# Patient Record
Sex: Female | Born: 1990 | Race: Black or African American | Hispanic: No | Marital: Single | Smoking: Never smoker
Health system: Southern US, Community
[De-identification: ages and names within clinical notes are randomized; demographics above are authoritative.]

---
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
## 2009-07-10 ENCOUNTER — Emergency Department (HOSPITAL_COMMUNITY): Admission: EM | Admit: 2009-07-10 | Discharge: 2009-07-10 | Payer: Self-pay | Admitting: Emergency Medicine

## 2011-10-16 ENCOUNTER — Ambulatory Visit: Payer: Self-pay | Admitting: Family Medicine

## 2011-11-06 ENCOUNTER — Ambulatory Visit: Payer: Self-pay | Admitting: Family Medicine

## 2012-07-13 ENCOUNTER — Encounter (HOSPITAL_COMMUNITY): Payer: Self-pay | Admitting: *Deleted

## 2012-07-13 ENCOUNTER — Emergency Department (HOSPITAL_COMMUNITY)
Admission: EM | Admit: 2012-07-13 | Discharge: 2012-07-13 | Disposition: A | Discharge status: Home / Self Care | Payer: 59 | Attending: Emergency Medicine | Admitting: Emergency Medicine

## 2012-07-13 DIAGNOSIS — R51 Headache: Secondary | ICD-10-CM | POA: Insufficient documentation

## 2012-07-13 DIAGNOSIS — R61 Generalized hyperhidrosis: Secondary | ICD-10-CM | POA: Insufficient documentation

## 2012-07-13 DIAGNOSIS — R1084 Generalized abdominal pain: Secondary | ICD-10-CM | POA: Insufficient documentation

## 2012-07-13 DIAGNOSIS — M549 Dorsalgia, unspecified: Secondary | ICD-10-CM | POA: Insufficient documentation

## 2012-07-13 DIAGNOSIS — R52 Pain, unspecified: Secondary | ICD-10-CM | POA: Insufficient documentation

## 2012-07-13 DIAGNOSIS — R110 Nausea: Secondary | ICD-10-CM | POA: Insufficient documentation

## 2012-07-13 DIAGNOSIS — H538 Other visual disturbances: Secondary | ICD-10-CM | POA: Insufficient documentation

## 2012-07-13 NOTE — ED Notes (Signed)
EDPA at bedside for assessment 

## 2012-07-13 NOTE — ED Provider Notes (Signed)
Medical screening examination/treatment/procedure(s) were performed by non-physician practitioner and as supervising physician I was immediately available for consultation/collaboration.  Julie Manly, MD 07/13/12 2255 

## 2012-07-13 NOTE — ED Provider Notes (Signed)
History     CSN: [id number]  Arrival date & time 07/13/12  0316   None     Chief Complaint  Patient presents with  . Influenza    (Consider location/radiation/quality/duration/timing/severity/associated sxs/prior treatment) HPI Comments: Patient is a 22 year old female with no significant past medical history who presents with a variety of vague complaints. Patient states she has had a intermittent L temporal headache, nausea, blurry vision, difficulty concentrating, night sweats, and generalized abdominal discomfort. Patient states that all of these symptoms have been coming and going over the past 2 weeks with no worsening of symptoms. She denies aggravating and alleviating factors the symptoms. Patient also complains of a soreness inferior to her left shoulder blade, denying trauma to her back. Patient further denies fever, eye pain, ear pain or discharge, trouble swallowing, chest pain, shortness of breath or difficulty breathing, vomiting, diarrhea, hematuria, dysuria, inability to move her upper and lower extremities, as well as numbness and tingling.  Patient does not believe that her symptoms or flu related. She states she lives in a house with mold which has been a problem in the past; she admits to experiencing these symptoms a few years ago prior to the mold being cleaned out of her house. Patient has also been told by the clinic says she's been going to that she may have multiple sclerosis. Patient has a positive family history of MS in her father's brother, but denies any other family history. Patient has not had testing to confirm this diagnosis.  Patient is a 22 y.o. female presenting with flu symptoms. The history is provided by the patient. No language interpreter was used.  Influenza Presenting symptoms: headache and nausea   Presenting symptoms: no diarrhea, no fever, no rhinorrhea, no shortness of breath, no sore throat and no vomiting   Associated symptoms: no chills, no  ear pain and no congestion     History reviewed. No pertinent past medical history.  History reviewed. No pertinent past surgical history.  History reviewed. No pertinent family history.  History  Substance Use Topics  . Smoking status: Never Smoker   . Smokeless tobacco: Not on file  . Alcohol Use: No    OB History   Grav Para Term Preterm Abortions TAB SAB Ect Mult Living                  Review of Systems  Constitutional: Negative for fever and chills.  HENT: Negative for ear pain, congestion, sore throat, rhinorrhea, voice change and ear discharge.   Eyes: Positive for visual disturbance. Negative for pain.  Respiratory: Negative for chest tightness and shortness of breath.   Cardiovascular: Negative for chest pain.  Gastrointestinal: Positive for nausea. Negative for vomiting and diarrhea.  Genitourinary: Negative for dysuria and hematuria.  Musculoskeletal: Positive for back pain.  Skin: Negative for color change.  Neurological: Positive for headaches. Negative for syncope, weakness, light-headedness and numbness.  All other systems reviewed and are negative.    Allergies  Review of patient's allergies indicates no known allergies.  Home Medications  No current outpatient prescriptions on file.  BP 127/80  Pulse 74  Temp(Src) 98.2 F (36.8 C) (Oral)  Resp 15  SpO2 98%  Physical Exam  Nursing note and vitals reviewed. Constitutional: She is oriented to person, place, and time. She appears well-developed and well-nourished. No distress.  HENT:  Head: Normocephalic and atraumatic.  Right Ear: External ear normal.  Left Ear: External ear normal.  Mouth/Throat: Oropharynx is clear and  moist. No oropharyngeal exudate.  Eyes: Conjunctivae and EOM are normal. Pupils are equal, round, and reactive to light. Right eye exhibits no discharge. Left eye exhibits no discharge.  Neck: Normal range of motion. Neck supple.  Cardiovascular: Normal rate, regular rhythm,  normal heart sounds and intact distal pulses.   No murmur heard. Pulmonary/Chest: Effort normal and breath sounds normal. No respiratory distress. She has no wheezes. She has no rales.  Abdominal: Soft. Bowel sounds are normal. She exhibits no distension and no mass. There is no tenderness. There is no rebound and no guarding.  Musculoskeletal: Normal range of motion. She exhibits no edema.  Lymphadenopathy:    She has no cervical adenopathy.  Neurological: She is alert and oriented to person, place, and time. She has normal strength and normal reflexes. No cranial nerve deficit or sensory deficit. Coordination normal. GCS eye subscore is 4. GCS verbal subscore is 5. GCS motor subscore is 6.  Skin: Skin is warm and dry. No rash noted. She is not diaphoretic. No erythema.  Psychiatric: She has a normal mood and affect. Her behavior is normal.    ED Course  Procedures (including critical care time)  Labs Reviewed - No data to display No results found.   1. Body aches   2. Generalized headaches     MDM  Patient presents with a plethora of vague symptoms x 2 weeks that are intermittent in nature and not worsening in onset. Patient also states ? Hx of MS. Physical exam findings benign. Patient is neurovascularly intact, speaks in paragraphs and follows simple commands. She has f/u at the [facility] on Tuesday. Patient will be advised to f/u at this appt regarding symptoms; no further w/u in ED warranted at this time. Patient discussed with Dr. Dayna Barker prior to ED d/c.  Filed Vitals:   07/13/12 0321 07/13/12 0704  BP: 138/89 127/80  Pulse: 83 74  Temp: 98.2 F (36.8 C)   TempSrc: Oral   Resp: 18 15  SpO2: 97% 98%           Antony Madura, PA-C 07/13/12 1532

## 2012-07-13 NOTE — ED Notes (Signed)
C/o flu like sx. Reports HA, body aches, low back pain, night sweats, trouble concentrating. States, "do not think this is r/t the flu, I am sure it is r/t mold in my house", "I know I have mold and i have had these sx before and it was the mold". Also mentions: "i was told I might have MS at a clinic, but they need to test for it and I don't have insurance", has not set up another appt with clinic yet.  Pt alert, NAD, calm, interactive, MAEx4, skin W&D, resps e/u, speaking in clear complete sentences. Ambulatory with steady gait. Here with other.

## 2012-07-13 NOTE — ED Notes (Signed)
PT reports increased fatigue, lightheadedness/dizziness, difficulty concentrating, difficulty "focusing my eyesight", headache - pain that increases/decreases intermittently, nausea with loss of appetite (denies V/D), and body aches - especially in arms and legs x1wk.

## 2014-02-25 ENCOUNTER — Ambulatory Visit: Payer: 59

## 2014-04-01 ENCOUNTER — Ambulatory Visit: Payer: 59 | Admitting: Internal Medicine

## 2016-07-12 ENCOUNTER — Ambulatory Visit: Payer: Self-pay | Admitting: Family Medicine

## 2016-07-12 NOTE — Progress Notes (Deleted)
   Subjective:  Patient ID: Shari Hampton, female    DOB: Aug 16, 1990  Shari SchoolingAge: 26 y.o. MRN: [id number]  CC: No chief complaint on file.   HPI Shari SchoolingShanikwa Hampton presents for   Headaches:   No outpatient prescriptions prior to visit.   No facility-administered medications prior to visit.     ROS Review of Systems      Objective:  There were no vitals taken for this visit.  BP/Weight 07/13/2012  Systolic BP 127  Diastolic BP 80     Physical Exam   Assessment & Plan:   Problem List Items Addressed This Visit    None      No orders of the defined types were placed in this encounter.   Follow-up: No Follow-up on file.   Shari BarkMandesia R Hairston FNP

## 2016-07-19 ENCOUNTER — Ambulatory Visit: Payer: Self-pay | Admitting: Family Medicine

## 2016-09-27 ENCOUNTER — Ambulatory Visit (INDEPENDENT_AMBULATORY_CARE_PROVIDER_SITE_OTHER): Payer: Self-pay | Admitting: Physician Assistant

## 2017-03-16 ENCOUNTER — Ambulatory Visit: Payer: Self-pay | Attending: Nurse Practitioner | Admitting: Nurse Practitioner

## 2017-03-16 ENCOUNTER — Encounter: Payer: Self-pay | Admitting: Nurse Practitioner

## 2017-03-16 VITALS — BP 113/82 | HR 92 | Temp 98.8°F | Resp 18 | Ht 70.0 in | Wt 189.6 lb

## 2017-03-16 DIAGNOSIS — Z809 Family history of malignant neoplasm, unspecified: Secondary | ICD-10-CM | POA: Insufficient documentation

## 2017-03-16 DIAGNOSIS — G629 Polyneuropathy, unspecified: Secondary | ICD-10-CM | POA: Insufficient documentation

## 2017-03-16 DIAGNOSIS — M6281 Muscle weakness (generalized): Secondary | ICD-10-CM | POA: Insufficient documentation

## 2017-03-16 DIAGNOSIS — Z833 Family history of diabetes mellitus: Secondary | ICD-10-CM | POA: Insufficient documentation

## 2017-03-16 DIAGNOSIS — Z8249 Family history of ischemic heart disease and other diseases of the circulatory system: Secondary | ICD-10-CM | POA: Insufficient documentation

## 2017-03-16 DIAGNOSIS — H539 Unspecified visual disturbance: Secondary | ICD-10-CM

## 2017-03-16 DIAGNOSIS — H532 Diplopia: Secondary | ICD-10-CM | POA: Insufficient documentation

## 2017-03-16 NOTE — Progress Notes (Signed)
Assessment & Plan:  Shari Hampton was seen today for new patient (initial visit).  Diagnoses and all orders for this visit:  Visual disturbances -     Ambulatory referral to Ophthalmology -     CBC -     CMP14+EGFR -     Lipid panel  Muscle weakness (generalized)/Neuropathy -     Vitamin B12   Subjective:   Chief Complaint  Patient presents with  . New Patient (Initial Visit)    Patient stated that she is having trouble balancing her walk. Patient stated that she is having double vision.    HPI Shari Hampton 26 y.o. female presents to office today with intermittent symptoms of loss of balance and ataxia.  She also endorses visual disturbances of seeing of spots or "it's like when the TV has static on the screen. That's what I see sometimes". She also endorses numbness, burning and tingling in her feet as well as heaviness in her legs along with muscle weakness. Symptoms are occurring sporadically. Onset of symptoms was 8 years ago. She has not seen a PCP since she was a young child. She has had a few ED visits in the past. One specifically was for "body aches" in 2014. At that time she was endorsing a past history of ?MS.  Today she reports being concerned she may have MS or some other neurological disorder. She has not seen a neurologist for her symptoms.   History reviewed. No pertinent past medical history.  History reviewed. No pertinent surgical history.  Family History  Problem Relation Age of Onset  . Cancer Father   . Cancer Brother   . Diabetes Mother   . Hypertension Mother     Social History   Socioeconomic History  . Marital status: Single    Spouse name: Not on file  . Number of children: Not on file  . Years of education: Not on file  . Highest education level: Not on file  Social Needs  . Financial resource strain: Not on file  . Food insecurity - worry: Not on file  . Food insecurity - inability: Not on file  . Transportation needs - medical: Not on  file  . Transportation needs - non-medical: Not on file  Occupational History  . Not on file  Tobacco Use  . Smoking status: Never Smoker  . Smokeless tobacco: Never Used  Substance and Sexual Activity  . Alcohol use: No  . Drug use: No  . Sexual activity: No  Other Topics Concern  . Not on file  Social History Narrative  . Not on file    No outpatient medications prior to visit.   No facility-administered medications prior to visit.     No Known Allergies  Review of Systems  Constitutional: Negative for fever, malaise/fatigue and weight loss.  HENT: Negative.  Negative for nosebleeds.   Eyes: Positive for blurred vision and double vision. Negative for photophobia.  Respiratory: Negative.  Negative for cough and shortness of breath.   Cardiovascular: Negative.  Negative for chest pain, palpitations and leg swelling.  Gastrointestinal: Negative.  Negative for abdominal pain, constipation, diarrhea, heartburn, nausea and vomiting.  Musculoskeletal: Negative.  Negative for myalgias.  Neurological: Positive for dizziness, tingling, speech change and focal weakness. Negative for tremors, sensory change, seizures, loss of consciousness and headaches.  Endo/Heme/Allergies: Negative for environmental allergies.       Heat sensitivity   Psychiatric/Behavioral: Negative.  Negative for suicidal ideas.  Objective:    Physical Exam  Constitutional: She is oriented to person, place, and time. She appears well-developed and well-nourished. She is cooperative.  HENT:  Head: Normocephalic and atraumatic.  Eyes: Conjunctivae and lids are normal. Pupils are equal, round, and reactive to light. Right eye exhibits nystagmus (mild). Right eye exhibits normal extraocular motion. Left eye exhibits nystagmus (mild). Left eye exhibits normal extraocular motion.  Neck: Normal range of motion.  Cardiovascular: Normal rate, regular rhythm, normal heart sounds and intact distal pulses. Exam  reveals no gallop and no friction rub.  No murmur heard. Pulmonary/Chest: Effort normal and breath sounds normal. No tachypnea. No respiratory distress. She has no decreased breath sounds. She has no wheezes. She has no rhonchi. She has no rales. She exhibits no tenderness.  Abdominal: Soft. Bowel sounds are normal.  Musculoskeletal: Normal range of motion. She exhibits no edema.  Neurological: She is alert and oriented to person, place, and time. She has normal strength. She displays no atrophy and no tremor. No cranial nerve deficit or sensory deficit. She exhibits normal muscle tone. She displays no seizure activity. Coordination and gait normal.  Skin: Skin is warm and dry.  Psychiatric: She has a normal mood and affect. Her behavior is normal. Judgment and thought content normal.  Nursing note and vitals reviewed.   BP 113/82 (BP Location: Left Arm, Patient Position: Sitting, Cuff Size: Normal)   Pulse 92   Temp 98.8 F (37.1 C) (Oral)   Resp 18   Ht 5' 10"  (1.778 m)   Wt 189 lb 9.6 oz (86 kg)   BMI 27.20 kg/m  Wt Readings from Last 3 Encounters:  03/16/17 189 lb 9.6 oz (86 kg)    Patient has been counseled on age-appropriate routine health concerns for screening and prevention. These are reviewed and up-to-date. Referrals have been placed accordingly. Immunizations are up-to-date or declined.       Patient has been counseled extensively about nutrition and exercise as well as the importance of adherence with medications and regular follow-up. The patient was given clear instructions to go to ER or return to medical center if symptoms don't improve, worsen or new problems develop. The patient verbalized understanding.   Follow-up: Return in about 4 weeks (around 04/13/2017) for follow up; Needs appointment with financial representative.,.   Gildardo Pounds, FNP-BC [facility] and [facility] [place], [place]   03/16/2017, 2:34 PM

## 2017-03-16 NOTE — Patient Instructions (Addendum)
Visual Disturbances A visual disturbance is any problem that interferes with your normal vision. You can have a visual disturbance in one eye or both eyes. Some types of visual disturbances come and go without treatment and do not cause a permanent problem. Other visual disturbances may be a sign of a serious medical emergency. There are many signs and symptoms of a visual disturbance, including:  Blurred vision.  Inability to see certain colors.  Seeing floating spots (floaters).  Light sensitivity.  Flashing or shimmering lights.  Zigzagging lines or stars.  Seeing the floor as tilted (visual midline shift).  Being unaware of objects on one side of the body (visual spatial inattention).  Double vision.  Rhythmic, involuntary eye movements (nystagmus).  Hallucinations.  Temporary or permanent blindness.  Follow these instructions at home:  Take over-the-counter and prescription medicines only as told by your health care provider.  To lower your risk of the problems that can lead to visual disturbances: ? Eat a healthy diet. ? Maintain a healthy weight. Lose weight if you need to. ? Exercise regularly. Ask your health care provider what activities are safe for you.  Avoid migraine triggers when possible.  Keep all follow-up visits as told by your health care provider. This is important. Contact a health care provider if:  Your visual disturbance changes or becomes worse.  You notice any new visual disturbances. Get help right away if:  You lose most or all of your vision in one eye or both eyes.  You experience visual hallucinations.  You have chest pain, nausea, or vomiting along with visual disturbances. This information is not intended to replace advice given to you by your health care provider. Make sure you discuss any questions you have with your health care provider. Document Released: 05/25/2004 Document Revised: 09/29/2015 Document Reviewed:  09/24/2013 Elsevier Interactive Patient Education  2018 [employer]  Weakness Weakness is a lack of strength. You may feel weak all over your body (generalized), or you may feel weak in one specific part of your body (focal). There are many potential causes of weakness. Sometimes, the cause of your weakness may not be known. Some causes of weakness can be serious, so it is important to see your doctor. Follow these instructions at home:  Rest as needed.  Try to get enough sleep. Talk to your doctor about how much sleep you need each night.  Take over-the-counter and prescription medicines only as told by your doctor.  Eat a healthy, well-balanced diet. This includes: ? Proteins to build muscles, such as lean meats and fish. ? Fresh fruits and vegetables. ? Carbohydrates to boost energy, such as whole grains.  Drink enough fluid to keep your pee (urine) clear or pale yellow.  Do strength exercises, such as arm curls and leg raises, for 30 minutes at least 2 days a week or as told by your doctor.  Think about working with a physical therapist or trainer to help you get stronger.  Keep all follow-up visits as told by your doctor. This is important. Contact a doctor if:  Your weakness does not get better or it gets worse.  Your weakness affects your ability to: ? Think clearly. ? Do your normal daily activities. Get help right away if:  You have sudden weakness.  You have trouble breathing or shortness of breath.  You have problems with your vision.  You have trouble talking or swallowing.  You have trouble standing or walking.  You have chest pain.  You are light-headed.  You pass out (lose consciousness). This information is not intended to replace advice given to you by your health care provider. Make sure you discuss any questions you have with your health care provider. Document Released: 03/30/2008 Document Revised: 05/13/2015 Document Reviewed:  02/05/2015 Elsevier Interactive Patient Education  [employer]2018 Elsevier Inc.

## 2017-03-17 LAB — CMP14+EGFR
A/G RATIO: 1.4 (ref 1.2–2.2)
ALT: 12 IU/L (ref 0–32)
AST: 19 IU/L (ref 0–40)
Albumin: 4.4 g/dL (ref 3.5–5.5)
Alkaline Phosphatase: 56 IU/L (ref 39–117)
BUN/Creatinine Ratio: 15 (ref 9–23)
BUN: 13 mg/dL (ref 6–20)
CALCIUM: 9.5 mg/dL (ref 8.7–10.2)
CHLORIDE: 104 mmol/L (ref 96–106)
CO2: 17 mmol/L — ABNORMAL LOW (ref 20–29)
Creatinine, Ser: 0.84 mg/dL (ref 0.57–1.00)
GFR calc non Af Amer: 96 mL/min/{1.73_m2} (ref >59)
GFR, EST AFRICAN AMERICAN: 110 mL/min/{1.73_m2} (ref >59)
GLUCOSE: 73 mg/dL (ref 65–99)
Globulin, Total: 3.2 g/dL (ref 1.5–4.5)
POTASSIUM: 4.2 mmol/L (ref 3.5–5.2)
Sodium: 140 mmol/L (ref 134–144)
TOTAL PROTEIN: 7.6 g/dL (ref 6.0–8.5)

## 2017-03-17 LAB — LIPID PANEL
Chol/HDL Ratio: 3.8 ratio (ref 0.0–4.4)
Cholesterol, Total: 197 mg/dL (ref 100–199)
HDL: 52 mg/dL (ref >39)
LDL Calculated: 127 mg/dL — ABNORMAL HIGH (ref 0–99)
TRIGLYCERIDES: 92 mg/dL (ref 0–149)
VLDL CHOLESTEROL CAL: 18 mg/dL (ref 5–40)

## 2017-03-17 LAB — CBC
Hematocrit: 40.3 % (ref 34.0–46.6)
Hemoglobin: 13.3 g/dL (ref 11.1–15.9)
MCH: 28.9 pg (ref 26.6–33.0)
MCHC: 33 g/dL (ref 31.5–35.7)
MCV: 87 fL (ref 79–97)
PLATELETS: 297 10*3/uL (ref 150–379)
RBC: 4.61 x10E6/uL (ref 3.77–5.28)
RDW: 14.5 % (ref 12.3–15.4)
WBC: 5.5 10*3/uL (ref 3.4–10.8)

## 2017-03-17 LAB — VITAMIN B12: VITAMIN B 12: 1064 pg/mL (ref 232–1245)

## 2017-03-19 ENCOUNTER — Telehealth: Payer: Self-pay

## 2017-03-19 NOTE — Telephone Encounter (Signed)
-----   Message from Claiborne RiggZelda W Fleming, NP sent at 03/19/2017 10:53 AM EST ----- Labs are essentially normal. Will evaluate for further testing during your next office visit.

## 2017-03-20 NOTE — Telephone Encounter (Signed)
!!!  Labs are essentially normal. Will evaluate for further testing during your next office visit.!!!  CMA attempt to call patient regarding her lab result. If patient called, please notified her lab result.

## 2017-03-20 NOTE — Telephone Encounter (Signed)
-----   Message from Zelda W Fleming, NP sent at 03/19/2017 10:53 AM EST ----- Labs are essentially normal. Will evaluate for further testing during your next office visit. 

## 2017-04-13 ENCOUNTER — Ambulatory Visit: Payer: Medicaid Other | Admitting: Nurse Practitioner

## 2017-04-18 ENCOUNTER — Ambulatory Visit: Payer: Medicaid Other | Admitting: Nurse Practitioner

## 2017-05-02 ENCOUNTER — Ambulatory Visit: Payer: Medicaid Other | Admitting: Nurse Practitioner

## 2017-09-25 ENCOUNTER — Encounter (HOSPITAL_COMMUNITY): Payer: Self-pay | Admitting: Emergency Medicine

## 2017-09-25 ENCOUNTER — Ambulatory Visit (HOSPITAL_COMMUNITY)
Admission: EM | Admit: 2017-09-25 | Discharge: 2017-09-25 | Disposition: A | Discharge status: Home / Self Care | Payer: Medicaid Other | Attending: Internal Medicine | Admitting: Internal Medicine

## 2017-09-25 DIAGNOSIS — R490 Dysphonia: Secondary | ICD-10-CM | POA: Insufficient documentation

## 2017-09-25 DIAGNOSIS — Z79899 Other long term (current) drug therapy: Secondary | ICD-10-CM | POA: Insufficient documentation

## 2017-09-25 DIAGNOSIS — J029 Acute pharyngitis, unspecified: Secondary | ICD-10-CM

## 2017-09-25 LAB — POCT RAPID STREP A: Streptococcus, Group A Screen (Direct): NEGATIVE

## 2017-09-25 MED ORDER — IPRATROPIUM BROMIDE 0.06 % NA SOLN: 2.0000 | Freq: Four times a day (QID) | NASAL | 0 refills | Status: DC

## 2017-09-25 MED ORDER — OMEPRAZOLE 20 MG PO CPDR: 20.0000 mg | DELAYED_RELEASE_CAPSULE | Freq: Every day | ORAL | 0 refills | Status: DC

## 2017-09-25 MED ORDER — FLUTICASONE PROPIONATE 50 MCG/ACT NA SUSP: 2.0000 | Freq: Every day | NASAL | 0 refills | Status: DC

## 2017-09-25 NOTE — ED Triage Notes (Signed)
Pt sts sore throat and has "lost her voice"

## 2017-09-25 NOTE — ED Provider Notes (Signed)
[facility]    CSN: [id number] Arrival date & time: 09/25/17  1650     History   Chief Complaint Chief Complaint  Patient presents with  . Sore Throat    HPI Shari Hampton is a 27 y.o. female.   27 year old female comes in for chronic sore throat and loss of voice.  HPI obtained through patient by writing due to painful talking and hoarseness.  States symptoms first started 6 years ago around the throat after singing or talking too much.  States since then, she will have similar flareups when she talks  worsenings to much.  States she can talk every day, but if she talks more than a few hours, it will start hurting, with hoarseness.  States current episode started about 2 months ago after talking too much had a family gathering, states pain is constant, drinking hot tea with some improvement but does not resolve symptoms.  Talking or swallowing makes symptoms worse.  States current episode felt as if it traveled down to the chest, and got her worried.  She  has painful swallowing without feelings of stricture.  She does have burning sensation to the chest that is slightly worse with eating. During these episodes, she denies urinary symptoms such as cough, congestion, rhinorrhea.  Denies abdominal pain, nausea, vomiting.  Denies injury to the throat.  Never smoker.  Patient states that she also started having "bumps "on the throat when she talks too much.  States she does not think it is acne, as she is never had acne to the neck.  States these bumps only occurs when she talks too much.  The bumps then resolve after she rests her voice, though it leaves scars.     History reviewed. No pertinent past medical history.  There are no active problems to display for this patient.   History reviewed. No pertinent surgical history.  OB History   None      Home Medications    Prior to Admission medications   Medication Sig Start Date End Date Taking? Authorizing Provider    fluticasone (FLONASE) 50 MCG/ACT nasal spray Place 2 sprays into both nostrils daily. 09/25/17   Cathie Hoops, Monserath Neff V, PA-C  ipratropium (ATROVENT) 0.06 % nasal spray Place 2 sprays into both nostrils 4 (four) times daily. 09/25/17   Cathie Hoops, Arta Stump V, PA-C  omeprazole (PRILOSEC) 20 MG capsule Take 1 capsule (20 mg total) by mouth daily. 09/25/17   Belinda Fisher, PA-C    Family History Family History  Problem Relation Age of Onset  . Cancer Father   . Cancer Brother   . Diabetes Mother   . Hypertension Mother     Social History Social History   Tobacco Use  . Smoking status: Never Smoker  . Smokeless tobacco: Never Used  Substance Use Topics  . Alcohol use: No  . Drug use: No     Allergies   Patient has no known allergies.   Review of Systems Review of Systems  Reason unable to perform ROS: See HPI as above.     Physical Exam Triage Vital Signs ED Triage Vitals [09/25/17 1739]  Enc Vitals Group     BP 125/86     Pulse Rate 80     Resp 18     Temp 98.5 F (36.9 C)     Temp Source Oral     SpO2 100 %     Weight      Height  Head Circumference      Peak Flow      Pain Score      Pain Loc      Pain Edu?      Excl. in GC?    No data found.  Updated Vital Signs BP 125/86 (BP Location: Left Arm)   Pulse 80   Temp 98.5 F (36.9 C) (Oral)   Resp 18   SpO2 100%   Physical Exam  Constitutional: She is oriented to person, place, and time. She appears well-developed and well-nourished.  Non-toxic appearance. She does not appear ill. No distress.  HENT:  Head: Normocephalic and atraumatic.  Right Ear: Tympanic membrane, external ear and ear canal normal. Tympanic membrane is not erythematous and not bulging.  Left Ear: Tympanic membrane, external ear and ear canal normal. Tympanic membrane is not erythematous and not bulging.  Nose: Nose normal. Right sinus exhibits no maxillary sinus tenderness and no frontal sinus tenderness. Left sinus exhibits no maxillary sinus tenderness  and no frontal sinus tenderness.  Mouth/Throat: Uvula is midline, oropharynx is clear and moist and mucous membranes are normal. No uvula swelling. No oropharyngeal exudate, posterior oropharyngeal edema or posterior oropharyngeal erythema.  Eyes: Pupils are equal, round, and reactive to light. Conjunctivae are normal.  Neck: Normal range of motion. Neck supple.  Cardiovascular: Normal rate, regular rhythm and normal heart sounds. Exam reveals no gallop and no friction rub.  No murmur heard. Pulmonary/Chest: Effort normal and breath sounds normal. She has no decreased breath sounds. She has no wheezes. She has no rhonchi. She has no rales.  Abdominal: Soft. Bowel sounds are normal. She exhibits no distension. There is no tenderness. There is no rebound and no guarding.  Lymphadenopathy:    She has no cervical adenopathy.  Neurological: She is alert and oriented to person, place, and time.  Skin: Skin is warm and dry.  See picture below. No tenderness to palpation.  Psychiatric: She has a normal mood and affect. Her behavior is normal. Judgment normal.         UC Treatments / Results  Labs (all labs ordered are listed, but only abnormal results are displayed) Labs Reviewed  CULTURE, GROUP A STREP [facility])  POCT RAPID STREP A    EKG None  Radiology No results found.  Procedures Procedures (including critical care time)  Medications Ordered in UC Medications - No data to display  Initial Impression / Assessment and Plan / UC Course  I have reviewed the triage vital signs and the nursing notes.  Pertinent labs & imaging results that were available during my care of the patient were reviewed by me and considered in my medical decision making (see chart for details).    Discussed case with Dr Delton See. Will treat for PND and GERD to assess for possible contributing symptoms. Start flonase and atrovent for possible post nasal drip. Start omeprazole for GERD. Given chronic  symptoms, discussed with patient, will need further evaluation. Patient to follow up with ENT for further evaluation needed. Resources provided. Patient expresses understanding and agrees to plan.  Final Clinical Impressions(s) / UC Diagnoses   Final diagnoses:  Sore throat  Hoarseness of voice    ED Prescriptions    Medication Sig Dispense Auth. Provider   ipratropium (ATROVENT) 0.06 % nasal spray Place 2 sprays into both nostrils 4 (four) times daily. 15 mL Kaydenn Mclear V, PA-C   fluticasone (FLONASE) 50 MCG/ACT nasal spray Place 2 sprays into both nostrils daily. 1 g  Cathie Hoops, Eoghan Belcher V, PA-C   omeprazole (PRILOSEC) 20 MG capsule Take 1 capsule (20 mg total) by mouth daily. 15 capsule Threasa Alpha, [place] 09/25/17 1940

## 2017-09-25 NOTE — Discharge Instructions (Signed)
As discussed, you need further evaluation of causes of chronic hoarseness and sore throat.  Start Flonase and Atrovent for possible postnasal drip.  Start omeprazole for acid reflux.  Please follow-up with ENT for further evaluation and management needed.

## 2017-09-28 LAB — CULTURE, GROUP A STREP (THRC)

## 2017-10-24 ENCOUNTER — Ambulatory Visit: Payer: Medicaid Other | Admitting: Nurse Practitioner

## 2017-11-10 ENCOUNTER — Encounter (HOSPITAL_COMMUNITY): Payer: Self-pay

## 2017-11-10 ENCOUNTER — Other Ambulatory Visit: Payer: Self-pay

## 2017-11-10 ENCOUNTER — Emergency Department (HOSPITAL_COMMUNITY)
Admission: EM | Admit: 2017-11-10 | Discharge: 2017-11-10 | Disposition: A | Discharge status: Home / Self Care | Payer: Medicaid Other | Attending: Emergency Medicine | Admitting: Emergency Medicine

## 2017-11-10 ENCOUNTER — Emergency Department (HOSPITAL_COMMUNITY): Payer: Medicaid Other

## 2017-11-10 DIAGNOSIS — R0789 Other chest pain: Secondary | ICD-10-CM | POA: Insufficient documentation

## 2017-11-10 DIAGNOSIS — R0602 Shortness of breath: Secondary | ICD-10-CM | POA: Insufficient documentation

## 2017-11-10 LAB — BASIC METABOLIC PANEL
Anion gap: 12 (ref 5–15)
BUN: 8 mg/dL (ref 6–20)
CALCIUM: 9.7 mg/dL (ref 8.9–10.3)
CHLORIDE: 106 mmol/L (ref 98–111)
CO2: 21 mmol/L — ABNORMAL LOW (ref 22–32)
CREATININE: 0.88 mg/dL (ref 0.44–1.00)
Glucose, Bld: 92 mg/dL (ref 70–99)
Potassium: 3.3 mmol/L — ABNORMAL LOW (ref 3.5–5.1)
SODIUM: 139 mmol/L (ref 135–145)

## 2017-11-10 LAB — I-STAT TROPONIN, ED: TROPONIN I, POC: 0 ng/mL (ref 0.00–0.08)

## 2017-11-10 LAB — I-STAT BETA HCG BLOOD, ED (MC, WL, AP ONLY): I-stat hCG, quantitative: <5 m[IU]/mL (ref <5)

## 2017-11-10 LAB — CBC
HCT: 40.8 % (ref 36.0–46.0)
Hemoglobin: 13.5 g/dL (ref 12.0–15.0)
MCH: 28.5 pg (ref 26.0–34.0)
MCHC: 33.1 g/dL (ref 30.0–36.0)
MCV: 86.3 fL (ref 78.0–100.0)
PLATELETS: 289 10*3/uL (ref 150–400)
RBC: 4.73 MIL/uL (ref 3.87–5.11)
RDW: 13.3 % (ref 11.5–15.5)
WBC: 8.4 10*3/uL (ref 4.0–10.5)

## 2017-11-10 MED ORDER — PREDNISONE 20 MG PO TABS: 40.0000 mg | ORAL_TABLET | Freq: Every day | ORAL | 0 refills | Status: AC

## 2017-11-10 MED ORDER — ALBUTEROL SULFATE HFA 108 (90 BASE) MCG/ACT IN AERS: 2.0000 | INHALATION_SPRAY | Freq: Four times a day (QID) | RESPIRATORY_TRACT | 2 refills | Status: DC | PRN

## 2017-11-10 NOTE — ED Provider Notes (Signed)
[facility] EMERGENCY DEPARTMENT Provider Note   CSN: [id number] Arrival date & time: 11/10/17  1257   History   Chief Complaint Chief Complaint  Patient presents with  . Shortness of Breath  . Chest Pain    HPI Dmya Long is a 27 y.o. female with no significant past medical history who presents to the ED with chest tightness and dyspnea that began 2 days ago. She reports a feeling of non-radiating tightness in her upper chest/base of her neck that is associated with a feeling that she can't get enough air into her lungs. She reports associated nausea and tingling in her hands, but denies vomiting and diaphoresis. She states that she has gotten episodes like this for the past 3 years. They occur at random times and sometimes will wake her up from sleep. They are sometimes associated with exercise, but they also occur when she is sitting relaxing. The episodes used to be infrequent, but now have been occurring twice per week and lasting for 2-3 days. She takes an all-natural peppermint inhaler which used to help, but is no longer providing relief. She reports seasonal allergies, but denies a history of childhood asthma. She denies rhinorrhea, congestion, or sore throat. She denies exposure to animals, but reports that her house has mold. Ms. Hemberger says she does not have a history of panic attacks and describes herself as a very "laid back person." She denies tobacco use or illicit drug use, including cocaine.   History reviewed. No pertinent past medical history.  There are no active problems to display for this patient.   History reviewed. No pertinent surgical history.   OB History   None      Home Medications    Prior to Admission medications   Medication Sig Start Date End Date Taking? Authorizing Provider  albuterol (PROVENTIL HFA;VENTOLIN HFA) 108 (90 Base) MCG/ACT inhaler Inhale 2 puffs into the lungs every 6 (six) hours as needed for wheezing or shortness  of breath (chest tightness). 11/10/17   Tyese Finken, Cathleen Corti, MD  predniSONE (DELTASONE) 20 MG tablet Take 2 tablets (40 mg total) by mouth daily with breakfast for 4 days. 11/10/17 11/14/17  Tylar Merendino, Cathleen Corti, MD    Family History Family History  Problem Relation Age of Onset  . Cancer Father   . Cancer Brother   . Diabetes Mother   . Hypertension Mother   No family history of heart disease.  Social History Social History   Tobacco Use  . Smoking status: Never Smoker  . Smokeless tobacco: Never Used  Substance Use Topics  . Alcohol use: No  . Drug use: No     Allergies   Patient has no known allergies.   Review of Systems Review of Systems  Constitutional: Negative for chills, diaphoresis and fever.  HENT: Negative for congestion, rhinorrhea, sinus pressure, sore throat and trouble swallowing.   Eyes: Negative for visual disturbance.  Respiratory: Positive for chest tightness. Negative for cough, shortness of breath and wheezing.   Cardiovascular: Negative for chest pain and leg swelling.  Gastrointestinal: Positive for constipation and nausea. Negative for abdominal pain, diarrhea and vomiting.  Genitourinary: Negative for difficulty urinating and dysuria.  Musculoskeletal: Positive for back pain and neck pain.  Skin: Negative for rash and wound.  Neurological:       Weakness in left hand. Tingling in both hands.     Physical Exam Updated Vital Signs BP 112/85 (BP Location: Right Arm)   Pulse 86  Temp 98.6 F (37 C)   Resp 16   LMP 10/11/2017   SpO2 100%   Physical Exam  Constitutional: She is oriented to person, place, and time. She appears well-developed and well-nourished. No distress.  HENT:  Head: Normocephalic and atraumatic.  Mouth/Throat: Oropharynx is clear and moist. No oropharyngeal exudate or posterior oropharyngeal edema.  Eyes: Pupils are equal, round, and reactive to light. EOM are normal.  Neck: Normal range of motion. Neck supple.  No  supraclavicular adenopathy. No clavicular deformities.  Cardiovascular: Normal rate, regular rhythm and intact distal pulses. Exam reveals no gallop and no friction rub.  No murmur heard. Pulmonary/Chest: Effort normal and breath sounds normal. No accessory muscle usage. No tachypnea. No respiratory distress. She has no wheezes. She has no rhonchi. She has no rales. She exhibits tenderness. She exhibits no bony tenderness and no deformity.  Abdominal: Soft. Bowel sounds are normal. She exhibits no distension. There is no tenderness.  Musculoskeletal:       Right lower leg: She exhibits no edema.       Left lower leg: She exhibits no edema.  Lymphadenopathy:    She has no cervical adenopathy.  Neurological: She is alert and oriented to person, place, and time.  Skin: Skin is warm and dry. Capillary refill takes less than 2 seconds. No rash noted.  Psychiatric: She has a normal mood and affect. Her behavior is normal.     ED Treatments / Results  Labs (all labs ordered are listed, but only abnormal results are displayed) Labs Reviewed  BASIC METABOLIC PANEL - Abnormal; Notable for the following components:      Result Value   Potassium 3.3 (*)    CO2 21 (*)    All other components within normal limits  CBC  I-STAT TROPONIN, ED  I-STAT BETA HCG BLOOD, ED ([facility], [facility], [facility] ONLY)    EKG EKG Interpretation  Date/Time:  Saturday November 10 2017 13:03:54 EDT Ventricular Rate:  112 PR Interval:  136 QRS Duration: 74 QT Interval:  334 QTC Calculation: 455 R Axis:   85 Text Interpretation:  Sinus tachycardia Biatrial enlargement Artifact Abnormal ekg Confirmed by Gerhard Munch [phone]) on 11/10/2017 3:29:49 PM   Radiology Dg Chest 2 View  Result Date: 11/10/2017 CLINICAL DATA:  Shortness of breath and chest tightness EXAM: CHEST - 2 VIEW COMPARISON:  None FINDINGS: The heart size and mediastinal contours are within normal limits. Both lungs are clear. The visualized skeletal structures are  unremarkable. IMPRESSION: No active cardiopulmonary disease. Electronically Signed   By: Signa Kell M.D.   On: 11/10/2017 14:11    Procedures Procedures (including critical care time)  Medications Ordered in ED Medications - No data to display   Initial Impression / Assessment and Plan / ED Course  I have reviewed the triage vital signs and the nursing notes.  Pertinent labs & imaging results that were available during my care of the patient were reviewed by me and considered in my medical decision making (see chart for details).  Clinical Course as of Nov 10 1641  Sat Nov 10, 2017  1557 MCV: 86.3 [DD]    Clinical Course User Index [DD] Day Greb, Cathleen Corti, MD   Chrisanne Loose is a 27 y.o. female with no significant past medical history who presents to the ED with upper chest tightness and dyspnea that began 2 days ago. She has had multiple episodes of similar symptoms which have become more frequently recently. DDx includes ACS, pulmonary disease  including PNA and asthma, and panic disorder. Upon arrival to the ED, patient was afebrile, normotensive, tachypneic at 20, and tachycardic to 112. Labs were remarkable for negative troponin. EKG showed normal sinus tachycardia with biaatrial enlargment. CXR was normal. Upon assessment, patient reported feeling better and was no longer tachycardic or tachypneic. Lungs were clear on exam. Based on this negative workup, the patient may be experiencing reversible bronchospasm associated with asthma that has resolved by the time of her lung exam. Discussed this with the patient and recommended following up with a PCP for formal PFTs. The patient will be discharged home with an albuterol inhaler to use for future episodes and a 4 day course of 40mg  prednisone. Panic attacks have not been ruled out and may be reconsidered if PFTs are negative for asthma.   Final Clinical Impressions(s) / ED Diagnoses   Final diagnoses:  Shortness of breath  Chest  tightness    ED Discharge Orders        Ordered    albuterol (PROVENTIL HFA;VENTOLIN HFA) 108 (90 Base) MCG/ACT inhaler  Every 6 hours PRN     11/10/17 1617    predniSONE (DELTASONE) 20 MG tablet  Daily with breakfast    Note to Pharmacy:  IM program   11/10/17 1617       Novak Stgermaine, Cathleen Cortieborah N, MD 11/10/17 1643    Gerhard MunchLockwood, Robert, MD 11/10/17 2346

## 2017-11-10 NOTE — ED Triage Notes (Signed)
Onset 3 days shortness of breath and chest tightness.  Pt tearful and anxious at triage.

## 2017-11-10 NOTE — Discharge Instructions (Addendum)
You came to the emergency department with chest tightness and shortness of breath. A thorough evaluation ruled out heart disease and we were unable to appreciate any signs of lung disease. However, you may have asthma based on your symptoms. You are safe for discharge home with a prescription for an albuterol inhaler and a 4 day course of prednisone 40 mg. Please establish care with a PCP (number provided) and f/u with [facility] for consideration of formal pulmonary function testing.

## 2017-11-10 NOTE — ED Notes (Signed)
ED Provider at bedside. 

## 2017-11-23 ENCOUNTER — Encounter: Payer: Self-pay | Admitting: Nurse Practitioner

## 2017-11-23 ENCOUNTER — Ambulatory Visit: Payer: Self-pay | Attending: Nurse Practitioner | Admitting: Nurse Practitioner

## 2017-11-23 VITALS — BP 119/83 | HR 96 | Temp 98.5°F | Ht 70.0 in | Wt 177.8 lb

## 2017-11-23 DIAGNOSIS — R200 Anesthesia of skin: Secondary | ICD-10-CM | POA: Insufficient documentation

## 2017-11-23 DIAGNOSIS — R0602 Shortness of breath: Secondary | ICD-10-CM | POA: Insufficient documentation

## 2017-11-23 DIAGNOSIS — Z79899 Other long term (current) drug therapy: Secondary | ICD-10-CM | POA: Insufficient documentation

## 2017-11-23 DIAGNOSIS — Z8249 Family history of ischemic heart disease and other diseases of the circulatory system: Secondary | ICD-10-CM | POA: Insufficient documentation

## 2017-11-23 DIAGNOSIS — R202 Paresthesia of skin: Secondary | ICD-10-CM | POA: Insufficient documentation

## 2017-11-23 DIAGNOSIS — Z833 Family history of diabetes mellitus: Secondary | ICD-10-CM | POA: Insufficient documentation

## 2017-11-23 NOTE — Progress Notes (Signed)
Assessment & Plan:  Shari Hampton was seen today for hospitalization follow-up.  Diagnoses and all orders for this visit:  Numbness and tingling -     TSH -     VITAMIN D 25 Hydroxy (Vit-D Deficiency, Fractures)  Shortness of breath Resolving  Patient has been counseled on age-appropriate routine health concerns for screening and prevention. These are reviewed and up-to-date. Referrals have been placed accordingly. Immunizations are up-to-date or declined.    Subjective:   Chief Complaint  Patient presents with  . Hospitalization Follow-up    Pt. is here for HFU. Pt. stated she is having problem with balance and weakness on her right foot.    HPI Shari Hampton 27 y.o. female presents to office today for follow up to shortness of breath.   Shortness of Breath Improved. She was seen in the ED 11-10-2017 with complaints of SHOB and chest tightness. Work up was essentially negative. She was prescribed prednisone and an albuterol inhaler which she reports today she did not pick up.  Today she states the shortness of breath has improved and almost resolved.    Neurological Symptoms She endorses numbness and tingling in her right hand and right foot. She states this has been ongoing for "about 9 years" has worsened over the past few weeks. She denies any falls but endorses unsteadiness. She had addressed these symptoms with me in November however at that time I encouraged her to obtain financial assistance and to follow back up with me in 4 weeks. She did neither. She also endorses visual disturbances: Bilateral blurred vision, spots (she has not seen an eye specialist in the past).  She reports her paternal uncle has MS and she is concerned that she may have MS as well. Other symptoms include new onset urinary leakage occuring with exercising only.    Review of Systems  Constitutional: Negative for fever, malaise/fatigue and weight loss.  HENT: Negative.  Negative for nosebleeds.   Eyes:  Positive for blurred vision. Negative for double vision and photophobia.  Respiratory: Positive for shortness of breath (mild). Negative for cough.   Cardiovascular: Negative.  Negative for chest pain, palpitations and leg swelling.  Gastrointestinal: Negative.  Negative for heartburn, nausea and vomiting.  Musculoskeletal: Negative.  Negative for falls, joint pain and myalgias.  Neurological: Positive for tingling, sensory change and weakness. Negative for dizziness, speech change, focal weakness, seizures and headaches.  Psychiatric/Behavioral: Negative for suicidal ideas. The patient is nervous/anxious (history of panic attacks).     History reviewed. No pertinent past medical history.  History reviewed. No pertinent surgical history.  Family History  Problem Relation Age of Onset  . Cancer Father   . Cancer Brother   . Diabetes Mother   . Hypertension Mother     Social History Reviewed with no changes to be made today.   Outpatient Medications Prior to Visit  Medication Sig Dispense Refill  . albuterol (PROVENTIL HFA;VENTOLIN HFA) 108 (90 Base) MCG/ACT inhaler Inhale 2 puffs into the lungs every 6 (six) hours as needed for wheezing or shortness of breath (chest tightness). 1 Inhaler 2   No facility-administered medications prior to visit.     No Known Allergies     Objective:    BP 119/83 (BP Location: Right Arm, Patient Position: Sitting, Cuff Size: Normal)   Pulse 96   Temp 98.5 F (36.9 C) (Oral)   Ht 5\' 10"  (1.778 m)   Wt 177 lb 12.8 oz (80.6 kg)   BMI 25.51 kg/m  Wt Readings from Last 3 Encounters:  11/23/17 177 lb 12.8 oz (80.6 kg)  03/16/17 189 lb 9.6 oz (86 kg)    Physical Exam  Constitutional: She is oriented to person, place, and time. She appears well-developed and well-nourished. She is cooperative.  HENT:  Head: Normocephalic and atraumatic.  Eyes: EOM are normal.  Neck: Normal range of motion.  Cardiovascular: Normal rate, regular rhythm, normal  heart sounds and intact distal pulses. Exam reveals no gallop and no friction rub.  No murmur heard. Pulmonary/Chest: Effort normal and breath sounds normal. No tachypnea. No respiratory distress. She has no decreased breath sounds. She has no wheezes. She has no rhonchi. She has no rales. She exhibits no tenderness.  Abdominal: Soft. Bowel sounds are normal.  Musculoskeletal: Normal range of motion. She exhibits no edema.  Neurological: She is alert and oriented to person, place, and time. She has normal strength. She displays normal reflexes. No cranial nerve deficit or sensory deficit. She exhibits normal muscle tone. She displays a negative Romberg sign. Coordination and gait normal.  Skin: Skin is warm and dry.  Psychiatric: Her speech is normal. Judgment and thought content normal. She is slowed. Cognition and memory are normal.  Affect is flat  Nursing note and vitals reviewed.     Patient has been counseled extensively about nutrition and exercise as well as the importance of adherence with medications and regular follow-up. The patient was given clear instructions to go to ER or return to medical center if symptoms don't improve, worsen or new problems develop. The patient verbalized understanding.   Follow-up: Return for schedule PAP, Needs appointment with financial representative.Claiborne Rigg.   Djon Tith W Eathan Groman, FNP-BC [facility] [place], [place] [phone]   11/23/2017, 11:41 AM

## 2017-11-23 NOTE — Patient Instructions (Signed)
Neuropathic Pain Neuropathic pain is pain caused by damage to the nerves that are responsible for certain sensations in your body (sensory nerves). The pain can be caused by damage to:  The sensory nerves that send signals to your spinal cord and brain (peripheral nervous system).  The sensory nerves in your brain or spinal cord (central nervous system).  Neuropathic pain can make you more sensitive to pain. What would be a minor sensation for most people may feel very painful if you have neuropathic pain. This is usually a long-term condition that can be difficult to treat. The type of pain can differ from person to person. It may start suddenly (acute), or it may develop slowly and last for a long time (chronic). Neuropathic pain may come and go as damaged nerves heal or may stay at the same level for years. It often causes emotional distress, loss of sleep, and a lower quality of life. What are the causes? The most common cause of damage to a sensory nerve is diabetes. Many other diseases and conditions can also cause neuropathic pain. Causes of neuropathic pain can be classified as:  Toxic. Many drugs and chemicals can cause toxic damage. The most common cause of toxic neuropathic pain is damage from drug treatment for cancer (chemotherapy).  Metabolic. This type of pain can happen when a disease causes imbalances that damage nerves. Diabetes is the most common of these diseases. Vitamin B deficiency caused by long-term alcohol abuse is another common cause.  Traumatic. Any injury that cuts, crushes, or stretches a nerve can cause damage and pain. A common example is feeling pain after losing an arm or leg (phantom limb pain).  Compression-related. If a sensory nerve gets trapped or compressed for a long period of time, the blood supply to the nerve can be cut off.  Vascular. Many blood vessel diseases can cause neuropathic pain by decreasing blood supply and oxygen to nerves.  Autoimmune.  This type of pain results from diseases in which the body's defense system mistakenly attacks sensory nerves. Examples of autoimmune diseases that can cause neuropathic pain include lupus and multiple sclerosis.  Infectious. Many types of viral infections can damage sensory nerves and cause pain. Shingles infection is a common cause of this type of pain.  Inherited. Neuropathic pain can be a symptom of many diseases that are passed down through families (genetic).  What are the signs or symptoms? The main symptom is pain. Neuropathic pain is often described as:  Burning.  Shock-like.  Stinging.  Hot or cold.  Itching.  How is this diagnosed? No single test can diagnose neuropathic pain. Your health care provider will do a physical exam and ask you about your pain. You may use a pain scale to describe how bad your pain is. You may also have tests to see if you have a high sensitivity to pain and to help find the cause and location of any sensory nerve damage. These tests may include:  Imaging studies, such as: ? X-rays. ? CT scan. ? MRI.  Nerve conduction studies to test how well nerve signals travel through your sensory nerves (electrodiagnostic testing).  Stimulating your sensory nerves through electrodes on your skin and measuring the response in your spinal cord and brain (somatosensory evoked potentials).  How is this treated? Treatment for neuropathic pain may change over time. You may need to try different treatment options or a combination of treatments. Some options include:  Over-the-counter pain relievers.  Prescription medicines. Some medicines   used to treat other conditions may also help neuropathic pain. These include medicines to: ? Control seizures (anticonvulsants). ? Relieve depression (antidepressants).  Prescription-strength pain relievers (narcotics). These are usually used when other pain relievers do not help.  Transcutaneous nerve stimulation (TENS).  This uses electrical currents to block painful nerve signals. The treatment is painless.  Topical and local anesthetics. These are medicines that numb the nerves. They can be injected as a nerve block or applied to the skin.  Alternative treatments, such as: ? Acupuncture. ? Meditation. ? Massage. ? Physical therapy. ? Pain management programs. ? Counseling.  Follow these instructions at home:  Learn as much as you can about your condition.  Take medicines only as directed by your health care provider.  Work closely with all your health care providers to find what works best for you.  Have a good support system at home.  Consider joining a chronic pain support group. Contact a health care provider if:  Your pain treatments are not helping.  You are having side effects from your medicines.  You are struggling with fatigue, mood changes, depression, or anxiety. This information is not intended to replace advice given to you by your health care provider. Make sure you discuss any questions you have with your health care provider. Document Released: 01/13/2004 Document Revised: 11/05/2015 Document Reviewed: 09/25/2013 Elsevier Interactive Patient Education  2018 [employer]  Peripheral Neuropathy Peripheral neuropathy is a type of nerve damage. It affects nerves that carry signals between the spinal cord and other parts of the body. These are called peripheral nerves. With peripheral neuropathy, one nerve or a group of nerves may be damaged. What are the causes? Many things can damage peripheral nerves. For some people with peripheral neuropathy, the cause is unknown. Some causes include:  Diabetes. This is the most common cause of peripheral neuropathy.  Injury to a nerve.  Pressure or stress on a nerve that lasts a long time.  Too little vitamin B. Alcoholism can lead to this.  Infections.  Autoimmune diseases, such as multiple sclerosis and systemic lupus  erythematosus.  Inherited nerve diseases.  Some medicines, such as cancer drugs.  Toxic substances, such as lead and mercury.  Too little blood flowing to the legs.  Kidney disease.  Thyroid disease.  What are the signs or symptoms? Different people have different symptoms. The symptoms you have will depend on which of your nerves is damaged. Common symptoms include:  Loss of feeling (numbness) in the feet and hands.  Tingling in the feet and hands.  Pain that burns.  Very sensitive skin.  Weakness.  Not being able to move a part of the body (paralysis).  Muscle twitching.  Clumsiness or poor coordination.  Loss of balance.  Not being able to control your bladder.  Feeling dizzy.  Sexual problems.  How is this diagnosed? Peripheral neuropathy is a symptom, not a disease. Finding the cause of peripheral neuropathy can be hard. To figure that out, your health care provider will take a medical history and do a physical exam. A neurological exam will also be done. This involves checking things affected by your brain, spinal cord, and nerves (nervous system). For example, your health care provider will check your reflexes, how you move, and what you can feel. Other types of tests may also be ordered, such as:  Blood tests.  A test of the fluid in your spinal cord.  Imaging tests, such as CT scans or an MRI.  Electromyography (  EMG). This test checks the nerves that control muscles.  Nerve conduction velocity tests. These tests check how fast messages pass through your nerves.  Nerve biopsy. A small piece of nerve is removed. It is then checked under a microscope.  How is this treated?  Medicine is often used to treat peripheral neuropathy. Medicines may include: ? Pain-relieving medicines. Prescription or over-the-counter medicine may be suggested. ? Antiseizure medicine. This may be used for pain. ? Antidepressants. These also may help ease pain from  neuropathy. ? Lidocaine. This is a numbing medicine. You might wear a patch or be given a shot. ? Mexiletine. This medicine is typically used to help control irregular heart rhythms.  Surgery. Surgery may be needed to relieve pressure on a nerve or to destroy a nerve that is causing pain.  Physical therapy to help movement.  Assistive devices to help movement. Follow these instructions at home:  Only take over-the-counter or prescription medicines as directed by your health care provider. Follow the instructions carefully for any given medicines. Do not take any other medicines without first getting approval from your health care provider.  If you have diabetes, work closely with your health care provider to keep your blood sugar under control.  If you have numbness in your feet: ? Check every day for signs of injury or infection. Watch for redness, warmth, and swelling. ? Wear padded socks and comfortable shoes. These help protect your feet.  Do not do things that put pressure on your damaged nerve.  Do not smoke. Smoking keeps blood from getting to damaged nerves.  Avoid or limit alcohol. Too much alcohol can cause a lack of B vitamins. These vitamins are needed for healthy nerves.  Develop a good support system. Coping with peripheral neuropathy can be stressful. Talk to a mental health specialist or join a support group if you are struggling.  Follow up with your health care provider as directed. Contact a health care provider if:  You have new signs or symptoms of peripheral neuropathy.  You are struggling emotionally from dealing with peripheral neuropathy.  You have a fever. Get help right away if:  You have an injury or infection that is not healing.  You feel very dizzy or begin vomiting.  You have chest pain.  You have trouble breathing. This information is not intended to replace advice given to you by your health care provider. Make sure you discuss any  questions you have with your health care provider. Document Released: 04/07/2002 Document Revised: 09/23/2015 Document Reviewed: 12/23/2012 Elsevier Interactive Patient Education  2017 Elsevier Inc.  

## 2017-11-24 LAB — TSH: TSH: 4.06 u[IU]/mL (ref 0.450–4.500)

## 2017-11-24 LAB — VITAMIN D 25 HYDROXY (VIT D DEFICIENCY, FRACTURES): Vit D, 25-Hydroxy: 52 ng/mL (ref 30.0–100.0)

## 2017-11-29 ENCOUNTER — Telehealth: Payer: Self-pay

## 2017-11-29 NOTE — Telephone Encounter (Signed)
-----   Message from Claiborne RiggZelda W Fleming, NP sent at 11/26/2017  8:52 AM EDT ----- Thyroid levels and Vitamin D levels are normal. If you are still having the numbness and tingling I would need for you to apply for financial assistance before I can refer you for additional testing.

## 2017-11-29 NOTE — Telephone Encounter (Signed)
CMA attempt to call patient. Patient was unavailable and inform patient's family member to have patient to call back for her results.  If patient call back, please inform:  Thyroid levels and Vitamin D levels are normal. If you are still having the numbness and tingling I would need for you to apply for financial assistance before I can refer you for additional testing.

## 2017-12-26 ENCOUNTER — Other Ambulatory Visit: Payer: Medicaid Other | Admitting: Nurse Practitioner

## 2018-01-21 ENCOUNTER — Other Ambulatory Visit: Payer: Medicaid Other | Admitting: Nurse Practitioner

## 2018-08-03 IMAGING — CR DG CHEST 2V
2 series · 2 of 2 positions shown · non-contrast
Comparison: None

CLINICAL DATA: Shortness of breath and chest tightness

EXAM:
CHEST - 2 VIEW

[chest pa]
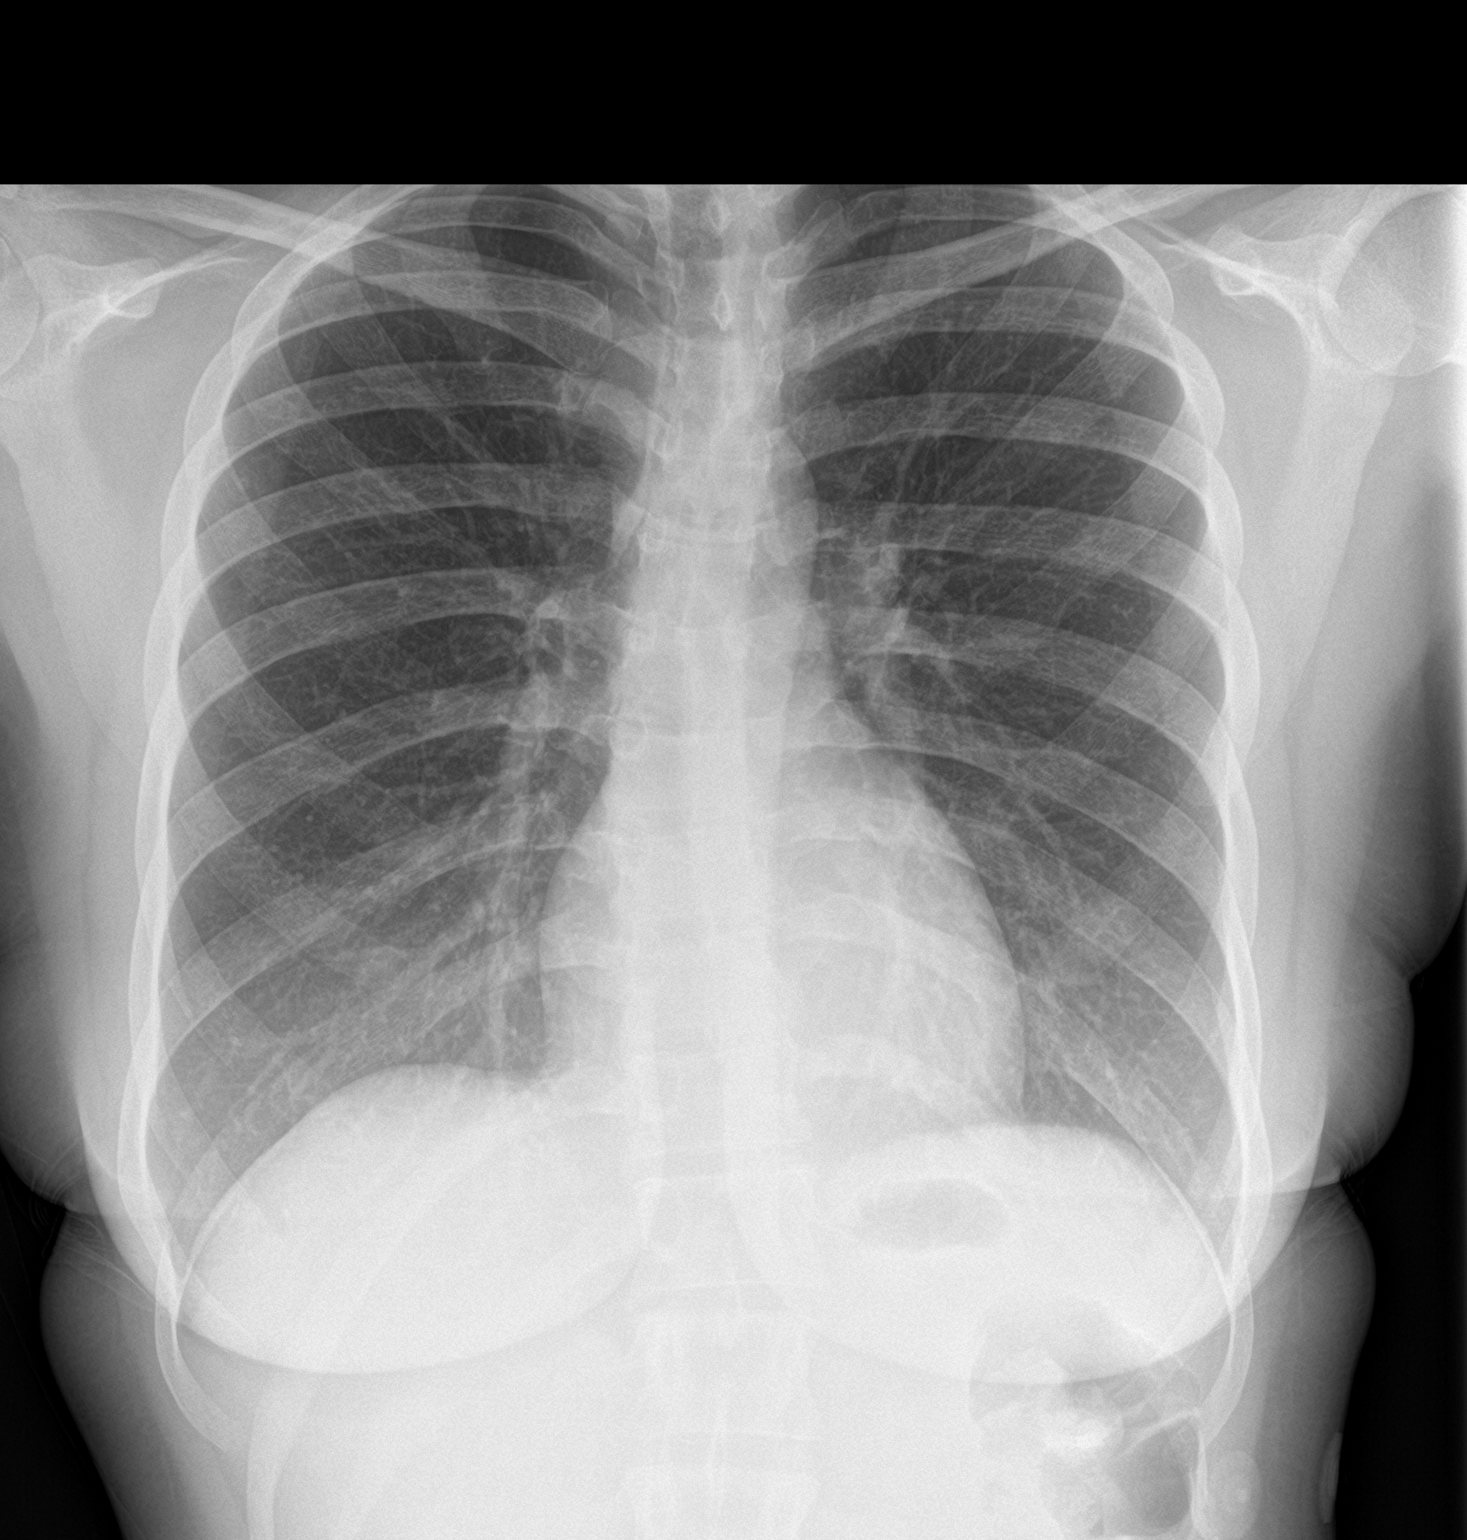

[chest lat]
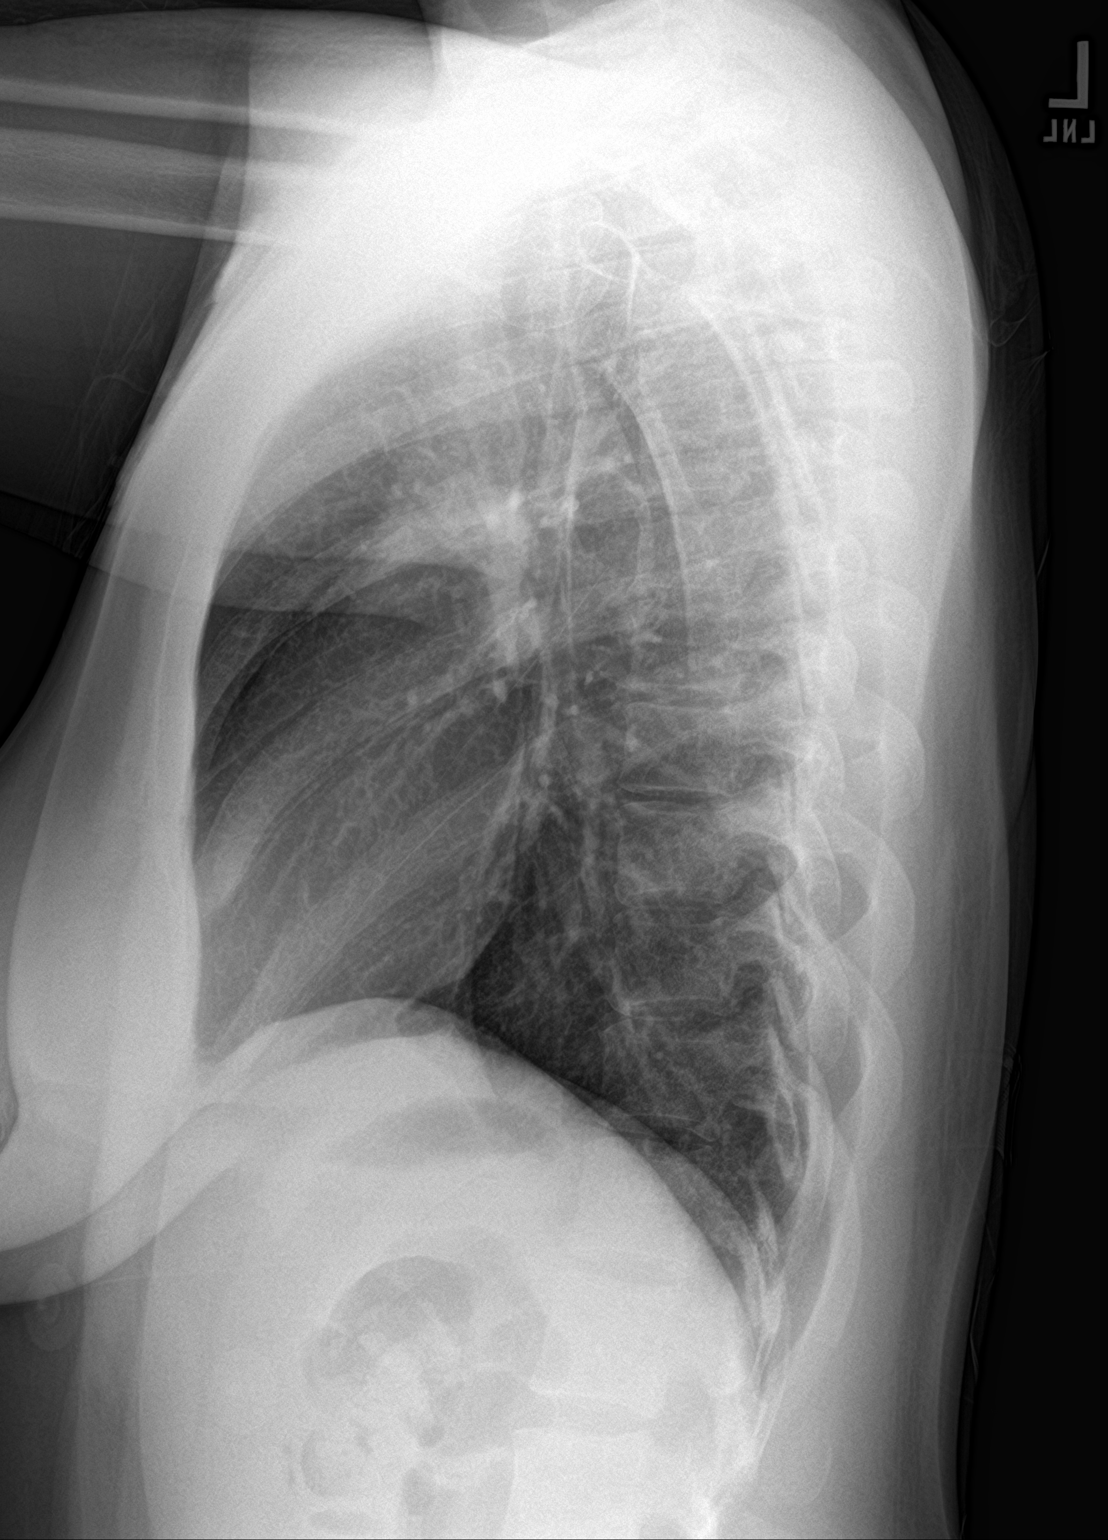

[2 of 2 positions shown; findings below may reference images not displayed]

FINDINGS: The heart size and mediastinal contours are within normal limits.
Both lungs are clear. The visualized skeletal structures are
unremarkable.
IMPRESSION: No active cardiopulmonary disease.

## 2019-02-28 ENCOUNTER — Ambulatory Visit: Payer: Medicaid Other | Admitting: Family Medicine

## 2019-03-06 ENCOUNTER — Ambulatory Visit: Payer: Medicaid Other

## 2019-04-02 ENCOUNTER — Ambulatory Visit: Payer: Medicaid Other

## 2019-05-20 ENCOUNTER — Other Ambulatory Visit: Payer: Self-pay

## 2019-05-20 ENCOUNTER — Encounter: Payer: Self-pay | Admitting: Nurse Practitioner

## 2019-05-20 ENCOUNTER — Ambulatory Visit: Payer: Self-pay | Attending: Nurse Practitioner | Admitting: Nurse Practitioner

## 2019-05-20 DIAGNOSIS — H6690 Otitis media, unspecified, unspecified ear: Secondary | ICD-10-CM

## 2019-05-20 DIAGNOSIS — H9203 Otalgia, bilateral: Secondary | ICD-10-CM

## 2019-05-20 MED ORDER — AMOXICILLIN-POT CLAVULANATE 875-125 MG PO TABS: 1.0000 | ORAL_TABLET | Freq: Two times a day (BID) | ORAL | 0 refills | Status: AC

## 2019-05-20 MED ORDER — FLUTICASONE PROPIONATE 50 MCG/ACT NA SUSP: 2.0000 | Freq: Every day | NASAL | 6 refills | Status: DC

## 2019-05-20 NOTE — Progress Notes (Signed)
Virtual Visit via Telephone Note Due to national recommendations of social distancing due to [place] 19, telehealth visit is felt to be most appropriate for this patient at this time.  I discussed the limitations, risks, security and privacy concerns of performing an evaluation and management service by telephone and the availability of in person appointments. I also discussed with the patient that there may be a patient responsible charge related to this service. The patient expressed understanding and agreed to proceed.    I connected with Shari Hampton on 05/20/19  at   3:10 PM EST  EDT by telephone and verified that I am speaking with the correct person using two identifiers.   Consent I discussed the limitations, risks, security and privacy concerns of performing an evaluation and management service by telephone and the availability of in person appointments. I also discussed with the patient that there may be a patient responsible charge related to this service. The patient expressed understanding and agreed to proceed.   Location of Patient: Private Residence   Location of Provider: [place] [facility] [place] participating in Telemedicine visit: Geryl Rankins FNP-BC [place]    History of Present Illness: Telemedicine visit for: Otitis media  Ear Pain: Patient complains of bilateral ear pain R>L  Onset of symptoms was a few months ago, gradually worsening since that time. She also notes a plugged sensation in both ears and ringing in both ears.  She does not have a history of ear infections.  She does not have a history of swimming.  She has tried Debrox for his symptoms with no relief of symptoms. States she has always experienced tinnitus off and on. However increased over the past 5 months. She tried to clean her ears out with debrox but symptoms persisted. Now endorses a feeling of fullness and there is pain with yawning and opening her  mouth.  Denies nasal congestion, cough, headaches, itchy or watery eyes.     History reviewed. No pertinent past medical history.  History reviewed. No pertinent surgical history.  Family History  Problem Relation Age of Onset  . Cancer Father   . Cancer Brother   . Diabetes Mother   . Hypertension Mother     Social History   Socioeconomic History  . Marital status: Single    Spouse name: Not on file  . Number of children: Not on file  . Years of education: Not on file  . Highest education level: Not on file  Occupational History  . Not on file  Tobacco Use  . Smoking status: Never Smoker  . Smokeless tobacco: Never Used  Substance and Sexual Activity  . Alcohol use: No  . Drug use: No  . Sexual activity: Never  Other Topics Concern  . Not on file  Social History Narrative  . Not on file   Social Determinants of Health   Financial Resource Strain:   . Difficulty of Paying Living Expenses: Not on file  Food Insecurity:   . Worried About Charity fundraiser in the Last Year: Not on file  . Ran Out of Food in the Last Year: Not on file  Transportation Needs:   . Lack of Transportation (Medical): Not on file  . Lack of Transportation (Non-Medical): Not on file  Physical Activity:   . Days of Exercise per Week: Not on file  . Minutes of Exercise per Session: Not on file  Stress:   . Feeling  of Stress : Not on file  Social Connections:   . Frequency of Communication with Friends and Family: Not on file  . Frequency of Social Gatherings with Friends and Family: Not on file  . Attends Religious Services: Not on file  . Active Member of Clubs or Organizations: Not on file  . Attends Banker Meetings: Not on file  . Marital Status: Not on file     Observations/Objective: Awake, alert and oriented x 3   Review of Systems  Constitutional: Negative for fever, malaise/fatigue and weight loss.  HENT: Positive for ear pain and tinnitus. Negative for  congestion, ear discharge, hearing loss, nosebleeds, sinus pain and sore throat.   Eyes: Negative.  Negative for blurred vision, double vision and photophobia.  Respiratory: Negative.  Negative for cough, shortness of breath and stridor.   Cardiovascular: Negative.  Negative for chest pain, palpitations and leg swelling.  Gastrointestinal: Negative.  Negative for heartburn, nausea and vomiting.  Musculoskeletal: Negative.  Negative for myalgias.  Neurological: Negative.  Negative for dizziness, focal weakness, seizures and headaches.  Psychiatric/Behavioral: Negative.  Negative for suicidal ideas.    Assessment and Plan: [name] was seen today for ear pain.  Diagnoses and all orders for this visit:  Acute otitis media, unspecified otitis media type -     fluticasone (FLONASE) 50 MCG/ACT nasal spray; Place 2 sprays into both nostrils daily. -     amoxicillin-clavulanate (AUGMENTIN) 875-125 MG tablet; Take 1 tablet by mouth 2 (two) times daily for 5 days.     Follow Up Instructions Return if symptoms worsen or fail to improve.     I discussed the assessment and treatment plan with the patient. The patient was provided an opportunity to ask questions and all were answered. The patient agreed with the plan and demonstrated an understanding of the instructions.   The patient was advised to call back or seek an in-person evaluation if the symptoms worsen or if the condition fails to improve as anticipated.  I provided 13 minutes of non-face-to-face time during this encounter including median intraservice time, reviewing previous notes, labs, imaging, medications and explaining diagnosis and management.  Claiborne Rigg, FNP-BC

## 2019-07-13 ENCOUNTER — Other Ambulatory Visit: Payer: Self-pay

## 2019-07-13 ENCOUNTER — Ambulatory Visit (HOSPITAL_COMMUNITY)
Admission: EM | Admit: 2019-07-13 | Discharge: 2019-07-13 | Disposition: A | Discharge status: Home / Self Care | Payer: Medicaid Other | Attending: Urgent Care | Admitting: Urgent Care

## 2019-07-13 ENCOUNTER — Encounter (HOSPITAL_COMMUNITY): Payer: Self-pay

## 2019-07-13 DIAGNOSIS — R200 Anesthesia of skin: Secondary | ICD-10-CM

## 2019-07-13 DIAGNOSIS — Z3202 Encounter for pregnancy test, result negative: Secondary | ICD-10-CM

## 2019-07-13 DIAGNOSIS — H6690 Otitis media, unspecified, unspecified ear: Secondary | ICD-10-CM

## 2019-07-13 DIAGNOSIS — R42 Dizziness and giddiness: Secondary | ICD-10-CM

## 2019-07-13 DIAGNOSIS — R0789 Other chest pain: Secondary | ICD-10-CM

## 2019-07-13 DIAGNOSIS — R110 Nausea: Secondary | ICD-10-CM

## 2019-07-13 LAB — POCT RAPID STREP A: Streptococcus, Group A Screen (Direct): NEGATIVE

## 2019-07-13 LAB — POCT URINALYSIS DIP (DEVICE)
Bilirubin Urine: NEGATIVE
Glucose, UA: NEGATIVE mg/dL
Hgb urine dipstick: NEGATIVE
Ketones, ur: 40 mg/dL — AB
Leukocytes,Ua: NEGATIVE
Nitrite: NEGATIVE
Protein, ur: NEGATIVE mg/dL
Specific Gravity, Urine: 1.02 (ref 1.005–1.030)
Urobilinogen, UA: 0.2 mg/dL (ref 0.0–1.0)
pH: 6.5 (ref 5.0–8.0)

## 2019-07-13 LAB — POCT PREGNANCY, URINE: Preg Test, Ur: NEGATIVE

## 2019-07-13 LAB — POC URINE PREG, ED: Preg Test, Ur: NEGATIVE

## 2019-07-13 MED ORDER — FLUTICASONE PROPIONATE 50 MCG/ACT NA SUSP: 2.0000 | Freq: Every day | NASAL | 0 refills | Status: AC

## 2019-07-13 MED ORDER — ONDANSETRON 8 MG PO TBDP: 8.0000 mg | ORAL_TABLET | Freq: Three times a day (TID) | ORAL | 0 refills | Status: AC | PRN

## 2019-07-13 MED ORDER — EYE WASH OPHTH SOLN
OPHTHALMIC | Status: AC
  Filled 2019-07-13: qty 118

## 2019-07-13 MED ORDER — ALBUTEROL SULFATE HFA 108 (90 BASE) MCG/ACT IN AERS: 2.0000 | INHALATION_SPRAY | Freq: Four times a day (QID) | RESPIRATORY_TRACT | 0 refills | Status: AC | PRN

## 2019-07-13 NOTE — ED Provider Notes (Signed)
[facility]   MRN: [id number] DOB: 11-Dec-1990  Subjective:   Shari Hampton is a 29 y.o. female presenting for 1 day hx of acute onset dizziness, numbness of hands and feet, nausea. Has also had shortness of breath/chest tightness for the past 2 weeks. Has not tried medications for nausea.   No current facility-administered medications for this encounter.  Current Outpatient Medications:  .  albuterol (PROVENTIL HFA;VENTOLIN HFA) 108 (90 Base) MCG/ACT inhaler, Inhale 2 puffs into the lungs every 6 (six) hours as needed for wheezing or shortness of breath (chest tightness)., Disp: 1 Inhaler, Rfl: 2 .  fluticasone (FLONASE) 50 MCG/ACT nasal spray, Place 2 sprays into both nostrils daily., Disp: 16 g, Rfl: 6   No Known Allergies  History reviewed. No pertinent past medical history.   History reviewed. No pertinent surgical history.  Family History  Problem Relation Age of Onset  . Cancer Father   . Cancer Brother   . Diabetes Mother   . Hypertension Mother     Social History   Tobacco Use  . Smoking status: Never Smoker  . Smokeless tobacco: Never Used  Substance Use Topics  . Alcohol use: No  . Drug use: No    ROS   Objective:   Vitals: BP 103/78 (BP Location: Right Arm)   Pulse 88   Temp 98.3 F (36.8 C) (Oral)   Resp 18   LMP 07/12/2019   SpO2 100%   Physical Exam Constitutional:      General: She is not in acute distress.    Appearance: Normal appearance. She is well-developed and normal weight. She is not ill-appearing, toxic-appearing or diaphoretic.  HENT:     Head: Normocephalic and atraumatic.     Right Ear: External ear normal.     Left Ear: External ear normal.     Nose: Nose normal.     Mouth/Throat:     Mouth: Mucous membranes are moist.     Pharynx: Oropharynx is clear.  Eyes:     General: No scleral icterus.    Extraocular Movements: Extraocular movements intact.     Pupils: Pupils are equal, round, and reactive to light.   Cardiovascular:     Rate and Rhythm: Normal rate and regular rhythm.     Pulses: Normal pulses.     Heart sounds: Normal heart sounds. No murmur. No friction rub. No gallop.   Pulmonary:     Effort: Pulmonary effort is normal. No respiratory distress.     Breath sounds: Normal breath sounds. No stridor. No wheezing, rhonchi or rales.  Abdominal:     General: Bowel sounds are normal. There is no distension.     Palpations: Abdomen is soft. There is no mass.     Tenderness: There is no abdominal tenderness. There is no right CVA tenderness, left CVA tenderness, guarding or rebound.  Skin:    General: Skin is warm and dry.     Coloration: Skin is not pale.     Findings: No rash.  Neurological:     General: No focal deficit present.     Mental Status: She is alert and oriented to person, place, and time.  Psychiatric:        Mood and Affect: Mood normal.        Behavior: Behavior normal.        Thought Content: Thought content normal.        Judgment: Judgment normal.    Results for orders placed or performed during  the hospital encounter of 07/13/19 (from the past 24 hour(s))  POCT urinalysis dip (device)     Status: Abnormal   Collection Time: 07/13/19  2:40 PM  Result Value Ref Range   Glucose, UA NEGATIVE NEGATIVE mg/dL   Bilirubin Urine NEGATIVE NEGATIVE   Ketones, ur 40 (A) NEGATIVE mg/dL   Specific Gravity, Urine 1.020 1.005 - 1.030   Hgb urine dipstick NEGATIVE NEGATIVE   pH 6.5 5.0 - 8.0   Protein, ur NEGATIVE NEGATIVE mg/dL   Urobilinogen, UA 0.2 0.0 - 1.0 mg/dL   Nitrite NEGATIVE NEGATIVE   Leukocytes,Ua NEGATIVE NEGATIVE  POC urine pregnancy     Status: None   Collection Time: 07/13/19  3:43 PM  Result Value Ref Range   Preg Test, Ur NEGATIVE NEGATIVE  Pregnancy, urine POC     Status: None   Collection Time: 07/13/19  3:43 PM  Result Value Ref Range   Preg Test, Ur NEGATIVE NEGATIVE  POCT rapid strep A [facility])     Status: None   Collection Time:  07/13/19  3:44 PM  Result Value Ref Range   Streptococcus, Group A Screen (Direct) NEGATIVE NEGATIVE    Assessment and Plan :   1. Numbness   2. Nausea without vomiting   3. Dizziness   4. Chest tightness   5. Acute otitis media, unspecified otitis media type     Lab review from 2019 was unremarkable. Vital signs and physical exam findings stable. POC labs reassuring. Refilled albuterol inhaler as she has gotten help with this in the past. Weather/season change may be eliciting chest tightness. Refilled Flonase. Use Zofran, general supportive care. Follow up with PCP asap.  Counseled patient on potential for adverse effects with medications prescribed/recommended today, ER and return-to-clinic precautions discussed, patient verbalized understanding.    Wallis Bamberg, PA-C 07/13/19 [id number]

## 2019-07-13 NOTE — Discharge Instructions (Signed)
Please make sure you follow up with your PCP regarding your chest tightness and numbness. Your lung sounds are clear today but we will refill your albuterol inhaler to see if this helps. If you develop chest pain, fevers, inability to breathe, then report to the ER for further work up including labs or chest scan as they deem appropriate. Use Tylenol for any aches and pains. Zofran for nausea and vomiting. Restart Flonase to address allergies.

## 2019-07-13 NOTE — ED Triage Notes (Signed)
Pt present nausea, dizziness and numbness in hands and feet. Symptom started last night.

## 2021-08-18 ENCOUNTER — Ambulatory Visit: Payer: Medicaid Other | Admitting: Physician Assistant

## 2021-11-14 ENCOUNTER — Ambulatory Visit: Payer: Medicaid Other | Admitting: Nurse Practitioner
